# Patient Record
Sex: Female | Born: 1965 | Race: White | Hispanic: No | Smoking: Current every day smoker
Health system: Southern US, Community
[De-identification: ages and names within clinical notes are randomized; demographics above are authoritative.]

## PROBLEM LIST (undated history)

## (undated) DIAGNOSIS — M1990 Unspecified osteoarthritis, unspecified site: Secondary | ICD-10-CM

## (undated) DIAGNOSIS — G709 Myoneural disorder, unspecified: Secondary | ICD-10-CM

## (undated) DIAGNOSIS — S73004A Unspecified dislocation of right hip, initial encounter: Secondary | ICD-10-CM

## (undated) DIAGNOSIS — T7840XA Allergy, unspecified, initial encounter: Secondary | ICD-10-CM

## (undated) DIAGNOSIS — S129XXA Fracture of neck, unspecified, initial encounter: Secondary | ICD-10-CM

## (undated) HISTORY — DX: Myoneural disorder, unspecified: G70.9

## (undated) HISTORY — DX: Allergy, unspecified, initial encounter: T78.40XA

## (undated) HISTORY — DX: Unspecified osteoarthritis, unspecified site: M19.90

## (undated) HISTORY — DX: Unspecified dislocation of right hip, initial encounter: S73.004A

## (undated) HISTORY — DX: Fracture of neck, unspecified, initial encounter: S12.9XXA

## (undated) HISTORY — PX: HERNIA REPAIR: SHX51

---

## 2008-02-01 DIAGNOSIS — S129XXA Fracture of neck, unspecified, initial encounter: Secondary | ICD-10-CM

## 2008-02-01 HISTORY — DX: Fracture of neck, unspecified, initial encounter: S12.9XXA

## 2008-12-24 ENCOUNTER — Emergency Department: Payer: Self-pay | Admitting: Emergency Medicine

## 2012-11-02 ENCOUNTER — Telehealth: Payer: Self-pay | Admitting: Family Medicine

## 2012-11-05 NOTE — Telephone Encounter (Signed)
Patient needs to be seen. Has exceeded time since last visit. Needs to bring all medications to next appointment. Needs to come after the funeral and see a provider.

## 2012-11-05 NOTE — Telephone Encounter (Signed)
Left message on phone for pt to call in for an appt with a provider

## 2012-11-06 ENCOUNTER — Encounter: Payer: Self-pay | Admitting: Family Medicine

## 2012-11-06 ENCOUNTER — Ambulatory Visit (INDEPENDENT_AMBULATORY_CARE_PROVIDER_SITE_OTHER): Payer: BC Managed Care – PPO | Admitting: Family Medicine

## 2012-11-06 VITALS — BP 117/77 | HR 75 | Temp 97.7°F | Ht 69.0 in | Wt 145.2 lb

## 2012-11-06 DIAGNOSIS — M5380 Other specified dorsopathies, site unspecified: Secondary | ICD-10-CM

## 2012-11-06 DIAGNOSIS — M62830 Muscle spasm of back: Secondary | ICD-10-CM | POA: Insufficient documentation

## 2012-11-06 MED ORDER — CYCLOBENZAPRINE HCL 5 MG PO TABS: 5.0000 mg | ORAL_TABLET | Freq: Three times a day (TID) | ORAL | Status: DC | PRN

## 2012-11-06 NOTE — Progress Notes (Signed)
Patient ID: Kathryn Mccall, female   DOB: October 05, 1965, 47 y.o.   MRN: [id number] SUBJECTIVE: CC: Chief Complaint  Patient presents with  . Acute Visit    back spams husband passed away last week..states had vicodin yest, and last carisoprodol and 800 mg ibupjrofen, stattes  was given carisoprodol  at "[facility] PER PT".     Patient came late.  HPI: Says her husband died recently.keep reliving events. He was under Hospice care and passed away. Has back spasms. She called recently for xanax and she was told she needed to be seen. She now comes in requesting the usual combination that works for her back spasms, ie, vicodin and soma. She did not have any injuries associated wit the back spasm. She has in the past presented wit injuries related to horse back riding and has ridden even with back pain.  She has been to [facility] that gave her a few somas and ibuprofen. 800 mg.  When questioned about the referral to the pain clinic, she made comments that the pain clinic was useless and that what they wanted to rx for her was a waste of time. She never went back.  No past medical history on file. No past surgical history on file. History   Social History  . Marital Status: Unknown    Spouse Name: N/A    Number of Children: N/A  . Years of Education: N/A   Occupational History  . Not on file.   Social History Main Topics  . Smoking status: Never Smoker   . Smokeless tobacco: Not on file  . Alcohol Use: Not on file  . Drug Use: Not on file  . Sexual Activity: Not on file   Other Topics Concern  . Not on file   Social History Narrative  . No narrative on file   No family history on file. No current outpatient prescriptions on file prior to visit.   No current facility-administered medications on file prior to visit.   No Known Allergies  There is no immunization history on file for this patient. Prior to Admission medications   Medication Sig Start Date End Date Taking?  Authorizing Provider  cyclobenzaprine (FLEXERIL) 5 MG tablet Take 1 tablet (5 mg total) by mouth 3 (three) times daily as needed for muscle spasms. 11/06/12   Ileana Ladd, MD       ROS: As above in the HPI. All other systems are stable or negative.  OBJECTIVE: APPEARANCE:  Patient in no acute distress.The patient appeared well nourished and normally developed. Acyanotic. Waist: VITAL SIGNS:BP 117/77  Pulse 75  Temp(Src) 97.7 F (36.5 C) (Oral)  Ht 5\' 9"  (1.753 m)  Wt 145 lb 3.2 oz (65.862 kg)  BMI 21.43 kg/m2  LMP 09/06/2012  WF S[employer] built. Complaining the her mid-back was swollen. No swelling noted.!!! SKIN: warm and  Dry without overt rashes, tattoos and scars  HEAD and Neck: without JVD, Head and scalp: normal Eyes:No scleral icterus. Fundi normal, eye movements normal. Ears: Auricle normal, canal normal, Tympanic membranes normal, insufflation normal. Nose: normal Throat: normal Neck & thyroid: normal  CHEST & LUNGS: Chest wall: normal Lungs: Clear  CVS: Reveals the PMI to be normally located. Regular rhythm, First and Second Heart sounds are normal,  absence of murmurs, rubs or gallops. Peripheral vasculature: Radial pulses: normal Dorsal pedis pulses: normal Posterior pulses: normal  ABDOMEN:  Appearance: normal Benign, no organomegaly, no masses, no Abdominal Aortic enlargement. No Guarding ,  no rebound. No Bruits. Bowel sounds: normal  RECTAL: N/A GU: N/A  EXTREMETIES: nonedematous.  MUSCULOSKELETAL:  Spine: she complained that her back was painful and in spasm. Yet when she dropped her keys accidentally on th eground she bent over and picked it up.  She was placed flat on her back and had her raise one leg against resistance and the opposing leg went into extension into the table without any back pain.!!!  Joints: intact  NEUROLOGIC: oriented to time,place and person; nonfocal. Strength is normal Cranial Nerves are normal.  Explained  to patient that her exam was not in keeping with needing narcotics, she got angry and cursed profanities in the exam room. She was getting ready to leave and requested a muscle relaxant. I Rx 10 days of flexeril only. And offerred NSAIDs and she cursed further. i requested that she seek health care else where. She subsequently threatened to write up a bad report for not caring for her pain needs. She was asked to seek care elsewhere because narcotics was not appropriate.  ASSESSMENT: Initially coming in for xanax now requesting narcotic pain medications with clinical signs not in keeping with the need for moderate to severe pain control. Disruptive behavior. Muscle spasm of back   PLAN: Recommend Grief counselling at Hospice Continue with the chiropractor in [place]. Meds ordered this encounter  Medications  . cyclobenzaprine (FLEXERIL) 5 MG tablet    Sig: Take 1 tablet (5 mg total) by mouth 3 (three) times daily as needed for muscle spasms.    Dispense:  30 tablet    Refill:  0   Recommended NSAID but patient refused.  See above.  Suspect dug seeking behavious.  Recommended pain specialist and rehab specialist.  Patient left.  Araf Clugston P. Modesto Charon, M.D.

## 2014-01-31 DIAGNOSIS — S73004A Unspecified dislocation of right hip, initial encounter: Secondary | ICD-10-CM

## 2014-01-31 HISTORY — DX: Unspecified dislocation of right hip, initial encounter: S73.004A

## 2017-02-06 ENCOUNTER — Ambulatory Visit: Payer: Self-pay | Admitting: *Deleted

## 2017-02-06 NOTE — Telephone Encounter (Signed)
Pt having complaints of neck spasms after coming from [facility]. Pt states she was referred to Theora Gianottihelle Jeffrey, PA by [place] in the [facility] of [facility]. Pt has not been seen at this office and new pt appt was scheduled for Friday 1/11 at 2:40pm. Pt states she has been getting injections in her neck due to falling on a hay stack approximately 3 weeks ago and pt states " a chip broke loose". Pt states she broke her neck about 7 years ago and has been dealing with pain since that time. Pt asking if she would be given medication to help deal with the spasms. Pt states she feels like the right side of her neck is locking up. Pt advised to go to Urgent Care to be treated for the spasm today until seen in office on Friday. Pt verbalized understanding.

## 2017-02-10 ENCOUNTER — Encounter: Payer: Self-pay | Admitting: Physician Assistant

## 2017-02-10 ENCOUNTER — Ambulatory Visit: Payer: Self-pay | Admitting: Physician Assistant

## 2017-02-10 ENCOUNTER — Ambulatory Visit (INDEPENDENT_AMBULATORY_CARE_PROVIDER_SITE_OTHER): Payer: Self-pay

## 2017-02-10 ENCOUNTER — Other Ambulatory Visit: Payer: Self-pay

## 2017-02-10 VITALS — BP 130/84 | HR 78 | Temp 97.5°F | Resp 18 | Ht 69.0 in | Wt 157.4 lb

## 2017-02-10 DIAGNOSIS — M62838 Other muscle spasm: Secondary | ICD-10-CM

## 2017-02-10 DIAGNOSIS — M62830 Muscle spasm of back: Secondary | ICD-10-CM

## 2017-02-10 MED ORDER — MELOXICAM 15 MG PO TABS: 15.0000 mg | ORAL_TABLET | Freq: Every day | ORAL | 1 refills | Status: AC

## 2017-02-10 MED ORDER — HYDROCODONE-ACETAMINOPHEN 5-325 MG PO TABS: 1.0000 | ORAL_TABLET | Freq: Three times a day (TID) | ORAL | 0 refills | Status: AC | PRN

## 2017-02-10 NOTE — Patient Instructions (Addendum)
STOP the ibuprofen. Continue the Flexeril (cyclobenzaprine). Take the meloxicam WITH FOOD.   We recommend that you schedule a mammogram for breast cancer screening. Typically, you do not need a referral to do this. Please contact a local imaging center to schedule your mammogram.  [facility] - [phone]  *ask for the Radiology Department [facility] - [phone] or [phone]  [facility] - [phone] [facility] - [phone] [facility] - [phone]) [phone]  *ask for the Radiology Department [facility] - [phone]) [phone]  *ask for the Radiology Department [facility] - [phone]  *ask for the Mammography Department [facility] - [phone]) [phone]   IF you received an x-ray today, you will receive an invoice from [facility]. Please contact [facility] at [phone] with questions or concerns regarding your invoice.   IF you received labwork today, you will receive an invoice from [facility]. Please contact [facility] at [phone] with questions or concerns regarding your invoice.   Our billing staff will not be able to assist you with questions regarding bills from these companies.  You will be contacted with the lab results as soon as they are available. The fastest way to get your results is to activate your My Chart account. Instructions are located on the last page of this paperwork. If you have not heard from us regarding the results in 2 weeks, please contact this office.

## 2017-02-10 NOTE — Progress Notes (Signed)
Subjective:    Patient ID: Kathryn Mccall, female    DOB: 15-Oct-1965, 52 y.o.   MRN: [id number]  Chief Complaint  Patient presents with  . Neck Pain    x1 month, pt states she fell off a haystack. Pt states she can't sleep at night and get rid of the spasms.   Patient broke her neck 8 years ago. Surgery was recommended to her, but she has declined because she is afraid of having multiple surgeries and wants to seek relief without surgery. She "shattered her C2 and C3" and has had "numbness" and "electrical shocks" that go down her arm. Numbness is most persistent in her 4th and 5th bilateral fingers.    She was seen in Oct 2014 for back spasms, via clinic note from this visit: "her exam was not in keeping with needing narcotics, she got angry and cursed profanities in the exam room." The provider prescribed Flexeril and recommended a pain clinic.  She was seen at an [facility] at [facility] on 11/20/2016. After reading the note from the visit, her reason for visit was "fell off haystack 2 days ago, having pain in neck with "electricity" down arms. She was advised to go to the ER to get a CT scan of her neck to rule out serious injury. She declined. She was prescribed Flexeril x 2 days and told to follow-up with PCP. The provider did not feel comfortable prescribing narcotics. "She was adamant that she feels ok if she will get the Hydrocodone and Flexeril." The X-ray machine was broken at the office, so images were taken. Patient left AMA.   Today, the patient notes she fell 15 feet from a hay stack "about 4 weeks ago." She describes muscles spasms that come and go. She has been receiving dry needling treatment from [facility] for the past 3 weeks. These have been helping some. She has also had difficulties sleeping since she fell. She cannot get comfortable and wakes up 3-4 times a night. She had a headache a few days ago which lasted "30 hours" and associated symptoms: nausea, vomiting,  photophobia and phonophobia. The dry needling treatment helped relieve the headache.   She has been taking 1600 mg of Ibuprofen BID. She states she has had imaging done in the past, but it has been a few years. "Hydrocodone and Flexeril for a few days are what helps."    Review of Systems  Eyes: Negative for visual disturbance.  Musculoskeletal: Positive for gait problem, joint swelling, neck pain and neck stiffness.  Neurological: Positive for weakness, numbness and headaches. Negative for speech difficulty.  Psychiatric/Behavioral: Positive for sleep disturbance.   Patient Active Problem List   Diagnosis Date Noted  . Muscle spasms of neck 02/10/2017  . Muscle spasm of back 11/06/2012   Prior to Admission medications   Medication Sig Start Date End Date Taking? Authorizing Provider  Ibuprofen (ADVIL PO) Take by mouth.   Yes [provider]  TURMERIC PO Take by mouth.   Yes [provider]  cyclobenzaprine (FLEXERIL) 5 MG tablet Take 1 tablet (5 mg total) by mouth 3 (three) times daily as needed for muscle spasms. Patient not taking: Reported on 02/10/2017 11/06/12   Ileana Ladd, MD  HYDROcodone-acetaminophen [facility]) 5-325 MG tablet Take 1-2 tablets by mouth every 8 (eight) hours as needed. 02/10/17   Porfirio Oar, PA-C  ibuprofen (ADVIL,MOTRIN) 200 MG tablet Take 200 mg by mouth.    [provider]  meloxicam (MOBIC) 15 MG tablet Take 1 tablet (15 mg total) by mouth daily. 02/10/17   Porfirio OarJeffery, Chelle, PA-C   Allergies  Allergen Reactions  . Mushroom Extract Complex Swelling    Tongue swells      Objective:   Physical Exam  Constitutional: She appears well-developed and well-nourished.  Cardiovascular: Normal rate, regular rhythm and normal heart sounds.  Pulmonary/Chest: Effort normal and breath sounds normal.  Musculoskeletal:       Cervical back: She exhibits spasm (right scapula). She exhibits no tenderness, no bony tenderness and no swelling.    Neurological: She has normal reflexes. No sensory deficit (sharp/dull sensation normal for upper extremities ).  Reflex Scores:      Tricep reflexes are 2+ on the right side and 2+ on the left side.      Bicep reflexes are 2+ on the right side and 2+ on the left side.      Brachioradialis reflexes are 2+ on the right side and 2+ on the left side.      Patellar reflexes are 2+ on the right side and 2+ on the left side. Psychiatric: Her affect is blunt.      Assessment & Plan:  1. Muscle spasms of neck 2. Muscle spasm of back X-ray of cervical spine ordered.  Patient declined muscle relaxants and Gabapentin. "They do nothing for me."  Stop Ibuprofen. Continue Flexeril. Start Norco and Mobic.  [facility].   - DG Cervical Spine Complete; Future  - meloxicam (MOBIC) 15 MG tablet; Take 1 tablet (15 mg total) by mouth daily.  Dispense: 30 tablet; Refill: 1 - HYDROcodone-acetaminophen (NORCO) 5-325 MG tablet; Take 1-2 tablets by mouth every 8 (eight) hours as needed.  Dispense: 20 tablet; Refill: 0   Return for re-evaluation pending radiographs and effectiveness of treatment.  Alfonse Alpersaroline Terral Cooks, PA-S

## 2017-02-10 NOTE — Progress Notes (Signed)
Patient ID: Kathryn Mccall, female     DOB: Mar 16, 1965, 52 y.o.    MRN: [id number]  PCP: Patient, No Pcp Per  Chief Complaint  Patient presents with  . Neck Pain    x1 month, pt states she fell off a haystack. Pt states she can't sleep at night and get rid of the spasms.    Subjective:   This patient is new to me and presents for evaluation of spasms of the RIGHT neck.  She reports a horse accident 8-10 years ago in which she sustained fractures of C2 and C3. SInce then, she had pain intermittently, when she injures herself, reporting good results with Ibuprofen, Vicodin and muscle relaxers. RElates no benefit with previous use of gabapentin. She has intermittent numbness and weakness in the 4th and 5th fingers.  About 1 month ago, she fell approximately 15 feet off a haystack, exacerbating her neck pain. Ibuprofen 1600 mg BID without benefit. She was seen at another facility, where she was prescribed cyclobenzaprine, and advised to go to the ED for CT scan of the neck, which she did not do. She has been having dry needling with her PT for the past 3 weeks without substantial improvement, and the PT recommended me.  The pain is keeping her awake at night, so she is exhausted. 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 Pain also associated with headache, which she describes as a migraine, though she has never been formally diagnosed with migraine.  Chart reviewed. -Seen 11/20/16, reportedly 2 days after the fall from the haystack (related today as being 4 weeks ago), for neck pain and electricity in the arms. She requested hydrocodone and cyclobenzaprine. She was given a 2 day course of cyclobenzaprine, advised to go the ED for the CT scan and to follow-up with her primary care provider. She left AMA, reportedly upset.  -Seen 11/06/2012 for muscle spasm of the back. Her husband had recently died. "She called recently for xanax and  she was told she needed to be seen. She now comes in requesting the usual combination that works for her back spasms, ie, vicodin and soma. She did not have any injuries associated wit the back spasm. She has in the past presented wit injuries related to horse back riding and has ridden even with back pain. She has been to a [facility] that gave her a few somas and ibuprofen. 800 mg. When questioned about the referral to the pain clinic, she made comments that the pain clinic was useless and that what they wanted to rx for her was a waste of time. She never went back. Explained to patient that her exam was not in keeping with needing narcotics, she got angry and cursed profanities in the exam room. She was getting ready to leave and requested a muscle relaxant. I Rx 10 days of flexeril only. And offerred NSAIDs and she cursed further. i requested that she seek health care else where. She subsequently threatened to write up a bad report for not caring for her pain needs. She was asked to seek care elsewhere because narcotics was not appropriate. ASSESSMENT: Initially coming in for xanax now requesting narcotic pain medications with clinical signs not in keeping with the need for moderate to severe pain control. Disruptive behavior. Muscle spasm of back PLAN: Recommend Grief counselling at Hospice Continue with the chiropractor in [place]."  Review of Systems  Constitutional: Negative for activity  change, appetite change, chills, diaphoresis, fatigue, fever and unexpected weight change.  HENT: Negative for congestion, dental problem, drooling, ear discharge, ear pain, facial swelling, hearing loss, mouth sores, nosebleeds, postnasal drip, rhinorrhea, sinus pressure, sinus pain, sneezing, sore throat, tinnitus, trouble swallowing and voice change.   Eyes: Negative for photophobia, pain, discharge, redness, itching and visual disturbance.  Respiratory: Negative for apnea, cough, choking, chest  tightness, shortness of breath, wheezing and stridor.   Cardiovascular: Negative for chest pain, palpitations and leg swelling.  Gastrointestinal: Negative for abdominal distention, abdominal pain, anal bleeding, blood in stool, constipation, diarrhea, nausea, rectal pain and vomiting.  Endocrine: Negative for cold intolerance, heat intolerance, polydipsia, polyphagia and polyuria.  Genitourinary: Negative for decreased urine volume, difficulty urinating, dyspareunia, dysuria, enuresis, flank pain, frequency, genital sores, hematuria, menstrual problem, pelvic pain, urgency, vaginal bleeding, vaginal discharge and vaginal pain.  Musculoskeletal: Positive for arthralgias, gait problem, joint swelling, myalgias, neck pain and neck stiffness. Negative for back pain.  Skin: Negative for color change, pallor, rash and wound.  Allergic/Immunologic: Negative for environmental allergies, food allergies and immunocompromised state.  Neurological: Positive for weakness, numbness and headaches. Negative for dizziness, tremors, seizures, syncope, facial asymmetry, speech difficulty and light-headedness.  Hematological: Negative for adenopathy. Does not bruise/bleed easily.  Psychiatric/Behavioral: Positive for dysphoric mood and sleep disturbance. Negative for agitation, behavioral problems, confusion, decreased concentration, hallucinations, self-injury and suicidal ideas. The patient is not nervous/anxious and is not hyperactive.     Depression screen PHQ 2/9 02/10/2017  Decreased Interest 0  Down, Depressed, Hopeless 0  PHQ - 2 Score 0     Prior to Admission medications   Medication Sig Start Date End Date Taking? Authorizing Provider  Ibuprofen (ADVIL PO) Take by mouth.   Yes [provider]  TURMERIC PO Take by mouth.   Yes [provider]  cyclobenzaprine (FLEXERIL) 5 MG tablet Take 1 tablet (5 mg total) by mouth 3 (three) times daily as needed for muscle spasms. Patient not  taking: Reported on 02/10/2017 11/06/12   Ileana Ladd, MD     No Known Allergies   Patient Active Problem List   Diagnosis Date Noted  . Muscle spasm of back 11/06/2012     Family History  Problem Relation Age of Onset  . Heart disease Mother   . Stroke Mother      Social History   Socioeconomic History  . Marital status: Widowed    Spouse name: Not on file  . Number of children: 1  . Years of education: College  . Highest education level: Not on file  Social Needs  . Financial resource strain: Not on file  . Food insecurity - worry: Not on file  . Food insecurity - inability: Not on file  . Transportation needs - medical: Not on file  . Transportation needs - non-medical: Not on file  Occupational History  . Occupation: Paediatric nurse  Tobacco Use  . Smoking status: Current Every Day Smoker    Packs/day: 0.50    Years: 10.00    Pack years: 5.00    Types: Cigarettes  . Smokeless tobacco: Never Used  Substance and Sexual Activity  . Alcohol use: Yes    Alcohol/week: 1.2 - 1.8 oz    Types: 2 - 3 Standard drinks or equivalent per week  . Drug use: Yes    Types: Marijuana  . Sexual activity: No  Other Topics Concern  . Not on file  Social History Narrative   Widowed in  2014.   [place] a farm, manages a large horse barn.         Objective:  Physical Exam  Constitutional: She is oriented to person, place, and time. She appears well-developed and well-nourished. She is active and cooperative. No distress.  BP 130/84 (BP Location: Left Arm, Patient Position: Sitting, Cuff Size: Normal)   Pulse 78   Temp (!) 97.5 F (36.4 C) (Oral)   Resp 18   Ht 5\' 9"  (1.753 m)   Wt 157 lb 6.4 oz (71.4 kg)   SpO2 98%   BMI 23.24 kg/m   HENT:  Head: Normocephalic and atraumatic.  Right Ear: Hearing normal.  Left Ear: Hearing normal.  Eyes: Conjunctivae are normal. No scleral icterus.  Neck: Normal range of motion. Neck supple. No thyromegaly present.    Cardiovascular: Normal rate, regular rhythm and normal heart sounds.  Pulses:      Radial pulses are 2+ on the right side, and 2+ on the left side.  Pulmonary/Chest: Effort normal and breath sounds normal.  Musculoskeletal:       Cervical back: She exhibits decreased range of motion, tenderness, pain and spasm. She exhibits no bony tenderness, no swelling, no edema, no deformity, no laceration and normal pulse.       Back:  Lymphadenopathy:       Head (right side): No tonsillar, no preauricular, no posterior auricular and no occipital adenopathy present.       Head (left side): No tonsillar, no preauricular, no posterior auricular and no occipital adenopathy present.    She has no cervical adenopathy.       Right: No supraclavicular adenopathy present.       Left: No supraclavicular adenopathy present.  Neurological: She is alert and oriented to person, place, and time. She has normal strength. No cranial nerve deficit or sensory deficit.  Skin: Skin is warm, dry and intact. No rash noted. No cyanosis or erythema. Nails show no clubbing.  Psychiatric: Her speech is normal and behavior is normal. Judgment and thought content normal. Her mood appears anxious. Her affect is not angry, not blunt, not labile and not inappropriate. Cognition and memory are normal. She does not exhibit a depressed mood.             Assessment & Plan:   Problem List Items Addressed This Visit    Muscle spasm of back   Muscle spasms of neck - Primary    Acute on chronic. Await radiographs. STOP ibuprofen. She is over using it. Trial of meloxicam. Declines re-try of gabapentin. Has not tried pregabalin, duloxetine. Continue cyclobenzaprine. Short course of hydrocodone.      Relevant Medications   meloxicam (MOBIC) 15 MG tablet   HYDROcodone-acetaminophen (NORCO) 5-325 MG tablet   Other Relevant Orders   DG Cervical Spine Complete (Completed)       Return for re-evaluation pending radiographs  and effectiveness of treatment.   Fernande Brashelle S. Benford Asch, PA-C Primary Care at [facility]

## 2017-02-11 ENCOUNTER — Encounter: Payer: Self-pay | Admitting: Physician Assistant

## 2017-02-11 NOTE — Progress Notes (Signed)
[place]CC[place] reviewed. No controlled substance prescriptions for this patient in the past 24 months.

## 2017-02-11 NOTE — Assessment & Plan Note (Signed)
Acute on chronic. Await radiographs. STOP ibuprofen. She is over using it. Trial of meloxicam. Declines re-try of gabapentin. Has not tried pregabalin, duloxetine. Continue cyclobenzaprine. Short course of hydrocodone.

## 2017-02-14 ENCOUNTER — Telehealth: Payer: Self-pay | Admitting: Physician Assistant

## 2017-02-14 MED ORDER — CYCLOBENZAPRINE HCL 5 MG PO TABS: 5.0000 mg | ORAL_TABLET | Freq: Three times a day (TID) | ORAL | 0 refills | Status: DC | PRN

## 2017-02-14 NOTE — Telephone Encounter (Signed)
-----   Message from Clydia Llanoodica Rosenthal sent at 02/14/2017  3:18 PM EST ----- Called, spoke with patient, informed of results. The patient stated that between Mobic and Hydrocodone she feel much better, but she will like something for sleep, some muscle relaxant. In 2014 she had Flexeril and helped her. [facility] is [facility] [address] [place]. Thank you.

## 2017-02-14 NOTE — Telephone Encounter (Signed)
Meds ordered this encounter  Medications  . cyclobenzaprine (FLEXERIL) 5 MG tablet    Sig: Take 1 tablet (5 mg total) by mouth 3 (three) times daily as needed for muscle spasms.    Dispense:  30 tablet    Refill:  0    Order Specific Question:   Supervising Provider    Answer:   Clelia CroftSHAW, EVA N [[id number]]

## 2019-01-22 ENCOUNTER — Telehealth: Payer: Self-pay | Admitting: Family Medicine

## 2019-01-22 NOTE — Telephone Encounter (Signed)
Received a referral from [place] for the patient to see Dr Tamala Julian for Arthralgia.  Called patient and left message for her to call back to schedule an appointment.

## 2020-08-04 ENCOUNTER — Emergency Department (HOSPITAL_COMMUNITY)
Admission: EM | Admit: 2020-08-04 | Discharge: 2020-08-05 | Disposition: A | Discharge status: Other Acute Inpatient Hospital | Payer: Self-pay | Attending: Emergency Medicine | Admitting: Emergency Medicine

## 2020-08-04 ENCOUNTER — Other Ambulatory Visit: Payer: Self-pay

## 2020-08-04 ENCOUNTER — Encounter (HOSPITAL_COMMUNITY): Payer: Self-pay | Admitting: Emergency Medicine

## 2020-08-04 DIAGNOSIS — Z20822 Contact with and (suspected) exposure to covid-19: Secondary | ICD-10-CM | POA: Insufficient documentation

## 2020-08-04 DIAGNOSIS — F17210 Nicotine dependence, cigarettes, uncomplicated: Secondary | ICD-10-CM | POA: Insufficient documentation

## 2020-08-04 DIAGNOSIS — F23 Brief psychotic disorder: Secondary | ICD-10-CM | POA: Insufficient documentation

## 2020-08-04 LAB — COMPREHENSIVE METABOLIC PANEL
ALT: 17 U/L (ref 0–44)
AST: 25 U/L (ref 15–41)
Albumin: 4.5 g/dL (ref 3.5–5.0)
Alkaline Phosphatase: 104 U/L (ref 38–126)
Anion gap: 11 (ref 5–15)
BUN: 25 mg/dL — ABNORMAL HIGH (ref 6–20)
CO2: 24 mmol/L (ref 22–32)
Calcium: 9.4 mg/dL (ref 8.9–10.3)
Chloride: 104 mmol/L (ref 98–111)
Creatinine, Ser: 0.79 mg/dL (ref 0.44–1.00)
GFR, Estimated: >60 mL/min (ref >60)
Glucose, Bld: 100 mg/dL — ABNORMAL HIGH (ref 70–99)
Potassium: 4.5 mmol/L (ref 3.5–5.1)
Sodium: 139 mmol/L (ref 135–145)
Total Bilirubin: 0.5 mg/dL (ref 0.3–1.2)
Total Protein: 8.3 g/dL — ABNORMAL HIGH (ref 6.5–8.1)

## 2020-08-04 LAB — ETHANOL: Alcohol, Ethyl (B): <10 mg/dL (ref <10)

## 2020-08-04 LAB — CBC WITH DIFFERENTIAL/PLATELET
Abs Immature Granulocytes: 0.02 10*3/uL (ref 0.00–0.07)
Basophils Absolute: 0.1 10*3/uL (ref 0.0–0.1)
Basophils Relative: 1 %
Eosinophils Absolute: 0.2 10*3/uL (ref 0.0–0.5)
Eosinophils Relative: 2 %
HCT: 41.1 % (ref 36.0–46.0)
Hemoglobin: 14.2 g/dL (ref 12.0–15.0)
Immature Granulocytes: 0 %
Lymphocytes Relative: 32 %
Lymphs Abs: 3.8 10*3/uL (ref 0.7–4.0)
MCH: 32 pg (ref 26.0–34.0)
MCHC: 34.5 g/dL (ref 30.0–36.0)
MCV: 92.6 fL (ref 80.0–100.0)
Monocytes Absolute: 1 10*3/uL (ref 0.1–1.0)
Monocytes Relative: 8 %
Neutro Abs: 6.6 10*3/uL (ref 1.7–7.7)
Neutrophils Relative %: 57 %
Platelets: 303 10*3/uL (ref 150–400)
RBC: 4.44 MIL/uL (ref 3.87–5.11)
RDW: 13.5 % (ref 11.5–15.5)
WBC: 11.7 10*3/uL — ABNORMAL HIGH (ref 4.0–10.5)
nRBC: 0 % (ref 0.0–0.2)

## 2020-08-04 LAB — TSH: TSH: 3.76 u[IU]/mL (ref 0.350–4.500)

## 2020-08-04 MED ORDER — ZOLPIDEM TARTRATE 5 MG PO TABS: 5.0000 mg | ORAL_TABLET | Freq: Every evening | ORAL | Status: DC | PRN

## 2020-08-04 MED ORDER — ONDANSETRON HCL 4 MG PO TABS: 4.0000 mg | ORAL_TABLET | Freq: Three times a day (TID) | ORAL | Status: DC | PRN

## 2020-08-04 MED ORDER — NICOTINE 21 MG/24HR TD PT24
21.0000 mg | MEDICATED_PATCH | Freq: Every day | TRANSDERMAL | Status: DC
  Filled 2020-08-04: qty 1

## 2020-08-04 MED ORDER — ALUM & MAG HYDROXIDE-SIMETH 200-200-20 MG/5ML PO SUSP: 30.0000 mL | Freq: Four times a day (QID) | ORAL | Status: DC | PRN

## 2020-08-04 MED ORDER — ACETAMINOPHEN 325 MG PO TABS: 650.0000 mg | ORAL_TABLET | ORAL | Status: DC | PRN

## 2020-08-04 NOTE — ED Notes (Signed)
IVC paper work faxed to [employer] office at this time. Paper work also faxed to [facility] @ [phone] and [phone].

## 2020-08-04 NOTE — ED Triage Notes (Signed)
Pt brought in with [facility] due to having delusional thoughts. Last night pt let her horses free since God told her too. Then went and laid in creek beds because God told her too. Pt also went into someone's cabin without permission due to God telling her too.

## 2020-08-04 NOTE — ED Provider Notes (Signed)
[facility] EMERGENCY DEPARTMENT Provider Note   CSN: [id number] Arrival date & time: 08/04/20  1946     History Chief Complaint  Patient presents with   Psychiatric Evaluation    Kathryn Mccall is a 55 y.o. female.  HPI  This patient is a 55 year old female, according to the medical history she has a history of attention deficit disorder, she states that she takes occasional Adderall for this, I have reviewed the medical record and in February 28, 2020 the patient was seen, she had refills for her Adderall, she was also given cyclobenzaprine for muscle spasms, hydrocodone acetaminophen for back pain.  The patient presents in the care of the [facility] department after she was found with some very unusual behavior.  Evidently she had let all of the horses out [place], she states that someone was after her and she states that someone flying a Psychologist, occupational was supposed to come and pick her up at the airport.  She was found in a cabin, she has perseverations on God telling her to do different things.  She reports to me that she smokes marijuana, she also states that she uses mushrooms but will only use all natural things that she can grow.  She will not use any harder drugs.  The patient denies any physical complaints at this time, and during the course of the interview she has multiple different delusional thoughts.  It is unclear when the last time the patient slept was.  She denies other physical complaint.  Level 5 caveat applies secondary to the patient's delusional thought  Past Medical History:  Diagnosis Date   Allergy    Arthritis    Cervical spine fracture (HCC) 2010   Hip dislocation, right (HCC) 2016   Neuromuscular disorder [facility])     Patient Active Problem List   Diagnosis Date Noted   Muscle spasms of neck 02/10/2017   Muscle spasm of back 11/06/2012    Past Surgical History:  Procedure Laterality Date    CESAREAN SECTION  1996   HERNIA REPAIR     55 years old     OB History   No obstetric history on file.     Family History  Problem Relation Age of Onset   Heart disease Mother    Stroke Mother    Leukemia Maternal Grandmother     Social History   Tobacco Use   Smoking status: Every Day    Packs/day: 0.50    Years: 10.00    Pack years: 5.00    Types: Cigarettes   Smokeless tobacco: Never  Vaping Use   Vaping Use: Never used  Substance Use Topics   Alcohol use: Yes    Alcohol/week: 2.0 - 3.0 standard drinks    Types: 2 - 3 Standard drinks or equivalent per week   Drug use: Yes    Types: Marijuana    Home Medications Prior to Admission medications   Medication Sig Start Date End Date Taking? Authorizing Provider  cyclobenzaprine (FLEXERIL) 5 MG tablet Take 1 tablet (5 mg total) by mouth 3 (three) times daily as needed for muscle spasms. 02/14/17   Porfirio Oar, PA  HYDROcodone-acetaminophen (NORCO) 5-325 MG tablet Take 1-2 tablets by mouth every 8 (eight) hours as needed. 02/10/17   Porfirio Oar, PA  Ibuprofen (ADVIL PO) Take by mouth.    [provider]  ibuprofen (ADVIL,MOTRIN) 200 MG tablet Take 200 mg by mouth.  [provider]  meloxicam (MOBIC) 15 MG tablet Take 1 tablet (15 mg total) by mouth daily. 02/10/17   Porfirio Oar, PA  TURMERIC PO Take by mouth.    [provider]    Allergies    Mushroom extract complex  Review of Systems   Review of Systems  Unable to perform ROS: Psychiatric disorder   Physical Exam Updated Vital Signs BP (!) 140/105 (BP Location: Right Arm)   Pulse 94   Temp 97.9 F (36.6 C) (Oral)   Resp 16   Ht 1.753 m (5\' 9" )   Wt 71.4 kg   SpO2 100%   BMI 23.25 kg/m   Physical Exam Vitals and nursing note reviewed.  Constitutional:      General: She is not in acute distress.    Appearance: She is well-developed.  HENT:     Head: Normocephalic and atraumatic.     Mouth/Throat:      Pharynx: No oropharyngeal exudate.  Eyes:     General: No scleral icterus.       Right eye: No discharge.        Left eye: No discharge.     Conjunctiva/sclera: Conjunctivae normal.     Pupils: Pupils are equal, round, and reactive to light.  Neck:     Thyroid: No thyromegaly.     Vascular: No JVD.  Cardiovascular:     Rate and Rhythm: Normal rate and regular rhythm.     Heart sounds: Normal heart sounds. No murmur heard.   No friction rub. No gallop.  Pulmonary:     Effort: Pulmonary effort is normal. No respiratory distress.     Breath sounds: Normal breath sounds. No wheezing or rales.  Abdominal:     General: Bowel sounds are normal. There is no distension.     Palpations: Abdomen is soft. There is no mass.     Tenderness: There is no abdominal tenderness.  Musculoskeletal:        General: No tenderness. Normal range of motion.     Cervical back: Normal range of motion and neck supple.  Lymphadenopathy:     Cervical: No cervical adenopathy.  Skin:    General: Skin is warm and dry.     Findings: No erythema or rash.  Neurological:     Mental Status: She is alert.     Coordination: Coordination normal.  Psychiatric:     Comments: Bizarre thoughts, flights of ideas, slightly pressured speech, curses frequently, talks about God frequently, delusional thoughts, denies suicidality or homicidality    ED Results / Procedures / Treatments   Labs (all labs ordered are listed, but only abnormal results are displayed) Labs Reviewed  COMPREHENSIVE METABOLIC PANEL - Abnormal; Notable for the following components:      Result Value   Glucose, Bld 100 (*)    BUN 25 (*)    Total Protein 8.3 (*)    All other components within normal limits  CBC WITH DIFFERENTIAL/PLATELET - Abnormal; Notable for the following components:   WBC 11.7 (*)    All other components within normal limits  RESP PANEL BY RT-PCR (FLU A&B, COVID) ARPGX2  ETHANOL  TSH  RAPID URINE DRUG SCREEN, HOSP PERFORMED   POC URINE PREG, ED    EKG None  Radiology No results found.  Procedures Procedures   Medications Ordered in ED Medications  acetaminophen (TYLENOL) tablet 650 mg (has no administration in time range)  zolpidem (AMBIEN) tablet 5 mg (has no administration in time  range)  ondansetron (ZOFRAN) tablet 4 mg (has no administration in time range)  alum & mag hydroxide-simeth (MAALOX/MYLANTA) 200-200-20 MG/5ML suspension 30 mL (has no administration in time range)  nicotine (NICODERM CQ - dosed in mg/24 hours) patch 21 mg (21 mg Transdermal Patient Refused/Not Given 08/04/20 2152)    ED Course  I have reviewed the triage vital signs and the nursing notes.  Pertinent labs & imaging results that were available during my care of the patient were reviewed by me and considered in my medical decision making (see chart for details).    MDM Rules/Calculators/A&P                          The patient has a very bizarre presentation of new onset delusions.  I do not see any history of psychiatric conditions other than attention deficit disorder.  She will need psychiatric evaluation, she will need to be involuntarily committed as the patient does have high risk of worsening condition and is not able to care for self at this time.  Laboratory work-up has been reviewed thus far, this patient is stable for evaluation and medically cleared for psychiatric placement  Change of shift - care signed out to Dr. Blinda Leatherwood  Final Clinical Impression(s) / ED Diagnoses Final diagnoses:  Acute psychosis [facility])    Rx / DC Orders ED Discharge Orders     None        Eber Hong, MD 08/04/20 2253

## 2020-08-04 NOTE — BH Assessment (Addendum)
Comprehensive Clinical Assessment (CCA) Note  08/05/2020 Kathryn Mccall [id number]  Discharge Disposition: Melbourne Abts, PA-C, has determined pt meets inpatient criteria. [name] referral information will be reviewed by [facility] Tosin, RN, to determine if an appropriate bed is available for her; if an appropriate bed is not available, pt's referral information will be faxed out to multiple hospitals for potential placement by SW. This information was relayed to pt's team at 2357.  The patient demonstrates the following risk factors for suicide: Chronic risk factors for suicide include: psychiatric disorder of Unspecified psychotic disorder . Acute risk factors for suicide include:  UTA . Protective factors for this patient include:  UTA . Considering these factors, the overall suicide risk at this point appears to be none. Patient is not appropriate for outpatient follow up.  Therefore, no sitter is recommended for suicide precautions.  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 The IVC states:  "Acutely delusional and psychosis. Let horses out of enclosure, sleeping in creek bed, breaking into cabins."  Pt denies SI, a hx of SI, prior attempts to kill herself, a plan to kill herself, or prior hospitalizations for mental health concerns. Pt denies HI, AH, NSSIB, or engagement with the legal system. She endorses experiencing VH, stating, "there's been signs in the sky." Pt shares she owns many guns due [place]. She shares she uses "mushrooms" recreationally and that she "micro-doses" on marijuana (takes 1-2  "hits") several times/day.  Pt provided verbal consent for clinician to make contact with a friend, Marta Lamas, at [phone]; clinician left a HIPAA-compliant voicemail message at [phone] requesting a call back to the [facility]). As of 0245 a returned phone call has not been received.  [name] UDA was positive for opiates, amphetamines, and THC.  [name] orientation and memory was UTA. Pt was cooperative throughout the assessment process. [name] insight, judgement, and impulse control is poor at this time.  Patient Reported Information How did you hear about [place]? Other (Comment) ([facility], [facility]P)  What Is the Reason for Your Visit/Call Today? Pt states she was brought to the hospital because, "they needed my blood to heal the nation; I'm the 1%." Pt shares this has been occurring "since the beginning of time." Pt has no known hx of delusions.  How Long Has This Been Causing You Problems? <Week  What Do You Feel Would Help You the Most Today? -- (UTA)   Have You Recently Had Any Thoughts About Hurting Yourself? No  Are You Planning to Commit Suicide/Harm Yourself At This time? No   Have you Recently Had Thoughts About Hurting Someone Karolee Ohs? No  Are You Planning to Harm Someone at This Time? No  Explanation: No data recorded  Have You Used Any Alcohol or Drugs in the Past 24 Hours? Yes  How Long Ago Did You Use Drugs or Alcohol? No data recorded What Did You Use and How Much? Pt states she used marijuana this morning.   Do You Currently Have a Therapist/Psychiatrist? -- (UTA)  Name of Therapist/Psychiatrist: No data recorded  Have You Been Recently Discharged From Any Office Practice or Programs? -- (UTA)  Explanation of Discharge From Practice/Program: No data recorded    CCA Screening Triage Referral Assessment Type of Contact: Tele-Assessment  Telemedicine Service Delivery: Telemedicine service delivery: This service was provided via telemedicine using a  2-way, interactive audio and video technology  Is this Initial or Reassessment? Initial Assessment  Date Telepsych consult ordered in [facility]:  08/04/20  Time Telepsych consult ordered in [facility]:  2109  Location of Assessment: [facility]  Provider Location: [facility] Assessment Services   Collateral Involvement: Pt provided verbal consent for clinician to speak to Marta Lamas. Clinician left a HIPAA-compliant voicemail message at [phone].   Does Patient Have a Automotive engineer Guardian? No data recorded Name and Contact of Legal Guardian: No data recorded If Minor and Not Living with Parent(s), Who has Custody? N/A  Is CPS involved or ever been involved? Never  Is APS involved or ever been involved? Never   Patient Determined To Be At Risk for Harm To Self or Others Based on Review of Patient Reported Information or Presenting Complaint? Yes, for Self-Harm  Method: No data recorded Availability of Means: No data recorded Intent: No data recorded Notification Required: No data recorded Additional Information for Danger to Others Potential: No data recorded Additional Comments for Danger to Others Potential: No data recorded Are There Guns or Other Weapons in Your Home? No data recorded Types of Guns/Weapons: No data recorded Are These Weapons Safely Secured?                            No data recorded Who Could Verify You Are Able To Have These Secured: No data recorded Do You Have any Outstanding Charges, Pending Court Dates, Parole/Probation? No data recorded Contacted To Inform of Risk of Harm To Self or Others: Law Enforcement    Does Patient Present under Involuntary Commitment? Yes  IVC Papers Initial File Date: 08/04/20   [place] of Residence: [place]   Patient Currently Receiving the Following Services: -- (UTA)   Determination of Need: Emergent (2 hours)   Options For Referral: Inpatient Hospitalization     CCA Biopsychosocial Patient Reported  Schizophrenia/Schizoaffective Diagnosis in Past: -- (UTA)   Strengths: Pt states she grows her own food and tries to primarily only eat what she grows.   Mental Health Symptoms Depression:   None   Duration of Depressive symptoms:    Mania:   Increased Energy; Overconfidence; Racing thoughts; Change in energy/activity   Anxiety:    None   Psychosis:   Delusions   Duration of Psychotic symptoms:  Duration of Psychotic Symptoms: Less than six months   Trauma:   None   Obsessions:   None   Compulsions:   None   Inattention:   None   Hyperactivity/Impulsivity:   None   Oppositional/Defiant Behaviors:   None   Emotional Irregularity:   Potentially harmful impulsivity   Other Mood/Personality Symptoms:   None noted    Mental Status Exam Appearance and self-care  Stature:   Average   Weight:   Average weight   Clothing:   -- (Pt is dressed in scrubs)   Grooming:   Normal   Cosmetic use:   None   Posture/gait:   Normal   Motor activity:   Not Remarkable   Sensorium  Attention:   Confused   Concentration:   Scattered   Orientation:   -- (  UTA)   Recall/memory:   -- (UTA)   Affect and Mood  Affect:   Full Range   Mood:   Euphoric   Relating  Eye contact:   Normal   Facial expression:   Responsive   Attitude toward examiner:   Cooperative   Thought and Language  Speech flow:  Flight of Ideas   Thought content:   Delusions   Preoccupation:   None   Hallucinations:   Visual (Pt states, "there's been signs in the sky.")   Organization:  No data recorded  Affiliated Computer ServicesExecutive Functions  Fund of Knowledge:   Average   Intelligence:   Average   Abstraction:   Abstract   Judgement:   Dangerous   Reality Testing:   Distorted   Insight:   Poor   Decision Making:   Confused   Social Functioning  Social Maturity:   Impulsive   Social Judgement:   Heedless   Stress  Stressors:   -- Industrial/product designer(UTA)   Coping Ability:    -- Industrial/product designer(UTA)   Skill Deficits:   Decision making; Self-control   Supports:   Friends/Service system     Religion: Religion/Spirituality Are You A Religious Person?:  (Pt states she is a spiritual person) How Might This Affect Treatment?: UTA  Leisure/Recreation: Leisure / Recreation Do You Have Hobbies?: Yes Leisure and Hobbies: Gardening, cooking the food she grows  Exercise/Diet: Exercise/Diet Do You Exercise?:  (UTA) Have You Gained or Lost A Significant Amount of Weight in the Past Six Months?:  (UTA) Do You Follow a Special Diet?:  (UTA) Do You Have Any Trouble Sleeping?:  (UTA)   CCA Employment/Education Employment/Work Situation: Employment / Work Situation Employment Situation:  Industrial/product designer(UTA) Patient's Job has Been Impacted by Current Illness:  (UTA) Has Patient ever Been in the [employer]ancorp[employer]?:  (UTA)  Education: Education Is Patient Currently Attending School?:  (UTA) Last Grade Completed:  (UTA) Did You Attend College?:  (UTA) Did You Have An Individualized Education Program (IIEP):  (UTA) Did You Have Any Difficulty At School?:  (UTA) Patient's Education Has Been Impacted by Current Illness:  (UTA)   CCA Family/Childhood History Family and Relationship History: Family history Marital status:  (UTA) Does patient have children?:  (UTA)  Childhood History:  Childhood History By whom was/is the patient raised?:  (UTA) Did patient suffer any verbal/emotional/physical/sexual abuse as a child?:  (UTA) Did patient suffer from severe childhood neglect?:  (UTA) Has patient ever been sexually abused/assaulted/raped as an adolescent or adult?:  (UTA) Was the patient ever a victim of a crime or a disaster?:  (UTA) Witnessed domestic violence?:  (UTA) Has patient been affected by domestic violence as an adult?:  Industrial/product designer(UTA)  Child/Adolescent Assessment:     CCA Substance Use Alcohol/Drug Use: Alcohol / Drug Use Pain Medications: See MAR Prescriptions: See MAR Over the  Counter: See MAR History of alcohol / drug use?: Yes Longest period of sobriety (when/how long): UTA Negative Consequences of Use:  (UTA) Withdrawal Symptoms:  (UTA) Substance #1 Name of Substance 1: Marijuana 1 - Age of First Use: UTA 1 - Amount (size/oz): Micro-doses throughout the day; less than 1/2 gram 1 - Frequency: Daily 1 - Duration: UTA 1 - Last Use / Amount: 08/04/2020 AM 1 - Method of Aquiring: UTA 1- Route of Use: Smoke Substance #2 Name of Substance 2: "Mushrooms" 2 - Age of First Use: UTA 2 - Amount (size/oz): UTA 2 - Frequency: UTA 2 - Duration: UTA 2 - Last Use / Amount:  UTA 2 - Method of Aquiring: Pt grows 2 - Route of Substance Use: Eats                     ASAM's:  Six Dimensions of Multidimensional Assessment  Dimension 1:  Acute Intoxication and/or Withdrawal Potential:   Dimension 1:  Description of individual's past and current experiences of substance use and withdrawal:  (UTA)  Dimension 2:  Biomedical Conditions and Complications:   Dimension 2:  Description of patient's biomedical conditions and  complications:  (UTA)  Dimension 3:  Emotional, Behavioral, or Cognitive Conditions and Complications:  Dimension 3:  Description of emotional, behavioral, or cognitive conditions and complications:  (UTA)  Dimension 4:  Readiness to Change:  Dimension 4:  Description of Readiness to Change criteria:  (UTA)  Dimension 5:  Relapse, Continued use, or Continued Problem Potential:  Dimension 5:  Relapse, continued use, or continued problem potential critiera description:  (UTA)  Dimension 6:  Recovery/Living Environment:  Dimension 6:  Recovery/Iiving environment criteria description:  (UTA)  ASAM Severity Score:    ASAM Recommended Level of Treatment: ASAM Recommended Level of Treatment:  (UTA)   Substance use Disorder (SUD) Substance Use Disorder (SUD)  Checklist Symptoms of Substance Use:  (UTA)  Recommendations for  Services/Supports/Treatments: Recommendations for Services/Supports/Treatments Recommendations For Services/Supports/Treatments: Inpatient Hospitalization  Discharge Disposition: Melbourne Abts, PA-C, has determined pt meets inpatient criteria. [name] referral information will be reviewed by [facility] Tosin, RN, to determine if an appropriate bed is available for her; if an appropriate bed is not available, pt's referral information will be faxed out to multiple hospitals for potential placement by SW. This information was relayed to pt's team at 2357.  DSM5 Diagnoses: Patient Active Problem List   Diagnosis Date Noted   Muscle spasms of neck 02/10/2017   Muscle spasm of back 11/06/2012     Referrals to Alternative Service(s): Referred to Alternative Service(s):   Place:   Date:   Time:    Referred to Alternative Service(s):   Place:   Date:   Time:    Referred to Alternative Service(s):   Place:   Date:   Time:    Referred to Alternative Service(s):   Place:   Date:   Time:     Ralph Dowdy, LMFT

## 2020-08-05 LAB — RESP PANEL BY RT-PCR (FLU A&B, COVID) ARPGX2
Influenza A by PCR: NEGATIVE
Influenza B by PCR: NEGATIVE
SARS Coronavirus 2 by RT PCR: NEGATIVE

## 2020-08-05 LAB — RAPID URINE DRUG SCREEN, HOSP PERFORMED
Amphetamines: POSITIVE — AB
Barbiturates: NOT DETECTED
Benzodiazepines: NOT DETECTED
Cocaine: NOT DETECTED
Opiates: POSITIVE — AB
Tetrahydrocannabinol: POSITIVE — AB

## 2020-08-05 LAB — PREGNANCY, URINE: Preg Test, Ur: NEGATIVE

## 2020-08-05 NOTE — ED Notes (Signed)
Patient lying in bed and alert to voice. Respirations even and unlabored. NAD noted. Report received from Ginger, Charity fundraiser.

## 2020-08-05 NOTE — Progress Notes (Signed)
Patient information has been sent to [facility] via secure chat to review for potential admission. Patient meets inpatient criteria per Melbourne Abts, PA-C.   Situation ongoing, CSW will continue to monitor progress.    Signed:  Damita Dunnings, MSW, LCSW-A  08/05/2020 9:53 AM

## 2020-08-05 NOTE — ED Notes (Signed)
Patient still has hair tie up

## 2020-08-05 NOTE — ED Provider Notes (Signed)
Emergency Medicine Observation Re-evaluation Note  Kathryn Mccall is a 55 y.o. female, seen on rounds today.  Pt initially presented to the ED for complaints of Psychiatric Evaluation Currently, the patient is calm.  Physical Exam  BP 132/90   Pulse 72   Temp 98.2 F (36.8 C) (Oral)   Resp 16   Ht 5\' 9"  (1.753 m)   Wt 71.4 kg   SpO2 100%   BMI 23.25 kg/m  Physical Exam General: NAD Cardiac: regular rate Lungs: no distress Psych: calm  ED Course / MDM  EKG:   I have reviewed the labs performed to date as well as medications administered while in observation.  Recent changes in the last 24 hours include : none - awaiting placement.  Plan  Current plan is for placement.   11:59 AM Accepted to [facility]   [facility], MD 08/05/20 1159

## 2020-08-05 NOTE — ED Notes (Signed)
Pt was very cooperative :)

## 2020-08-05 NOTE — ED Notes (Signed)
Patient used cellphone at this time. And states to her caller Miracle that she is ready to leave and walk up out of here. Patient redirected at this time.

## 2020-08-05 NOTE — Progress Notes (Signed)
Patient has been faxed out due to no available beds at [facility]. Patient meets inpatient criteria per [facility]. Patient referred to the following facilities:  [facility]  [address] [place] [address] [phone]  [facility]  [address], [place] [address] [phone]  [facility]  [address] [place] [address] [phone]  [facility]  [address], [place] [address] [phone]  [facility]  [address] [place] [address] [phone]  [facility]  [address], [place] [address] [phone]  [facility]  [address] [place], [place] [address] [phone]  [facility]  [address] [place], [place] [address] [phone]  [facility]  [address] [place] [address] [phone]  [facility]  [address], [place] [address] [phone]  [facility]  [address], [place] [address] [phone]  [facility]  [address] [place] [address] [phone]    CSW will continue to monitor disposition.    Damita Dunnings, MSW, LCSW-A  10:01 AM 08/05/2020

## 2020-08-05 NOTE — BH Assessment (Addendum)
Secure chat sent to pt's RN with request for TTS consult and cart set up. Advised by RN pt is discharged.
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
## 2020-09-06 ENCOUNTER — Emergency Department (HOSPITAL_COMMUNITY)
Admission: EM | Admit: 2020-09-06 | Discharge: 2020-09-07 | Disposition: A | Discharge status: Other Acute Inpatient Hospital | Payer: Self-pay | Attending: Emergency Medicine | Admitting: Emergency Medicine

## 2020-09-06 ENCOUNTER — Encounter (HOSPITAL_COMMUNITY): Payer: Self-pay

## 2020-09-06 DIAGNOSIS — M25551 Pain in right hip: Secondary | ICD-10-CM | POA: Insufficient documentation

## 2020-09-06 DIAGNOSIS — R440 Auditory hallucinations: Secondary | ICD-10-CM

## 2020-09-06 DIAGNOSIS — Y92410 Unspecified street and highway as the place of occurrence of the external cause: Secondary | ICD-10-CM | POA: Insufficient documentation

## 2020-09-06 DIAGNOSIS — X828XXA Other intentional self-harm by crashing of motor vehicle, initial encounter: Secondary | ICD-10-CM

## 2020-09-06 DIAGNOSIS — R102 Pelvic and perineal pain: Secondary | ICD-10-CM | POA: Insufficient documentation

## 2020-09-06 DIAGNOSIS — Y900 Blood alcohol level of less than 20 mg/100 ml: Secondary | ICD-10-CM | POA: Insufficient documentation

## 2020-09-06 DIAGNOSIS — Z79899 Other long term (current) drug therapy: Secondary | ICD-10-CM | POA: Insufficient documentation

## 2020-09-06 DIAGNOSIS — X822XXA Intentional collision of motor vehicle with tree, initial encounter: Secondary | ICD-10-CM | POA: Insufficient documentation

## 2020-09-06 DIAGNOSIS — F29 Unspecified psychosis not due to a substance or known physiological condition: Secondary | ICD-10-CM | POA: Insufficient documentation

## 2020-09-06 DIAGNOSIS — M25552 Pain in left hip: Secondary | ICD-10-CM | POA: Insufficient documentation

## 2020-09-06 DIAGNOSIS — M545 Low back pain, unspecified: Secondary | ICD-10-CM | POA: Insufficient documentation

## 2020-09-06 DIAGNOSIS — Z20822 Contact with and (suspected) exposure to covid-19: Secondary | ICD-10-CM | POA: Insufficient documentation

## 2020-09-06 DIAGNOSIS — F17210 Nicotine dependence, cigarettes, uncomplicated: Secondary | ICD-10-CM | POA: Insufficient documentation
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
## 2020-09-07 ENCOUNTER — Other Ambulatory Visit: Payer: Self-pay

## 2020-09-07 ENCOUNTER — Emergency Department (HOSPITAL_COMMUNITY): Payer: Self-pay

## 2020-09-07 LAB — CBC WITH DIFFERENTIAL/PLATELET
Abs Immature Granulocytes: 0.03 10*3/uL (ref 0.00–0.07)
Basophils Absolute: 0.1 10*3/uL (ref 0.0–0.1)
Basophils Relative: 1 %
Eosinophils Absolute: 0.1 10*3/uL (ref 0.0–0.5)
Eosinophils Relative: 1 %
HCT: 40.3 % (ref 36.0–46.0)
Hemoglobin: 13.9 g/dL (ref 12.0–15.0)
Immature Granulocytes: 0 %
Lymphocytes Relative: 29 %
Lymphs Abs: 2.8 10*3/uL (ref 0.7–4.0)
MCH: 31.5 pg (ref 26.0–34.0)
MCHC: 34.5 g/dL (ref 30.0–36.0)
MCV: 91.4 fL (ref 80.0–100.0)
Monocytes Absolute: 0.9 10*3/uL (ref 0.1–1.0)
Monocytes Relative: 9 %
Neutro Abs: 5.9 10*3/uL (ref 1.7–7.7)
Neutrophils Relative %: 60 %
Platelets: 290 10*3/uL (ref 150–400)
RBC: 4.41 MIL/uL (ref 3.87–5.11)
RDW: 14 % (ref 11.5–15.5)
WBC: 9.8 10*3/uL (ref 4.0–10.5)
nRBC: 0 % (ref 0.0–0.2)

## 2020-09-07 LAB — COMPREHENSIVE METABOLIC PANEL
ALT: 17 U/L (ref 0–44)
AST: 23 U/L (ref 15–41)
Albumin: 4.5 g/dL (ref 3.5–5.0)
Alkaline Phosphatase: 84 U/L (ref 38–126)
Anion gap: 11 (ref 5–15)
BUN: 19 mg/dL (ref 6–20)
CO2: 24 mmol/L (ref 22–32)
Calcium: 10 mg/dL (ref 8.9–10.3)
Chloride: 103 mmol/L (ref 98–111)
Creatinine, Ser: 0.92 mg/dL (ref 0.44–1.00)
GFR, Estimated: >60 mL/min (ref >60)
Glucose, Bld: 95 mg/dL (ref 70–99)
Potassium: 3.7 mmol/L (ref 3.5–5.1)
Sodium: 138 mmol/L (ref 135–145)
Total Bilirubin: 1 mg/dL (ref 0.3–1.2)
Total Protein: 7.9 g/dL (ref 6.5–8.1)

## 2020-09-07 LAB — RESP PANEL BY RT-PCR (FLU A&B, COVID) ARPGX2
Influenza A by PCR: NEGATIVE
Influenza B by PCR: NEGATIVE
SARS Coronavirus 2 by RT PCR: NEGATIVE

## 2020-09-07 LAB — ETHANOL: Alcohol, Ethyl (B): <10 mg/dL (ref <10)

## 2020-09-07 LAB — I-STAT BETA HCG BLOOD, ED (MC, WL, AP ONLY): I-stat hCG, quantitative: 6.2 m[IU]/mL — ABNORMAL HIGH (ref <5)

## 2020-09-07 MED ORDER — LORAZEPAM 1 MG PO TABS
1.0000 mg | ORAL_TABLET | Freq: Once | ORAL | Status: AC
  Administered 2020-09-07: 1 mg via ORAL
  Filled 2020-09-07: qty 2

## 2020-09-07 MED ORDER — ACETAMINOPHEN 325 MG PO TABS: 650.0000 mg | ORAL_TABLET | ORAL | Status: DC | PRN

## 2020-09-07 MED ORDER — ONDANSETRON HCL 4 MG PO TABS: 4.0000 mg | ORAL_TABLET | Freq: Three times a day (TID) | ORAL | Status: DC | PRN

## 2020-09-07 MED ORDER — ALUM & MAG HYDROXIDE-SIMETH 200-200-20 MG/5ML PO SUSP: 30.0000 mL | Freq: Four times a day (QID) | ORAL | Status: DC | PRN

## 2020-09-07 MED ORDER — NICOTINE 7 MG/24HR TD PT24
7.0000 mg | MEDICATED_PATCH | Freq: Every day | TRANSDERMAL | Status: DC
  Administered 2020-09-07: 7 mg via TRANSDERMAL
  Filled 2020-09-07 (×2): qty 1

## 2020-09-07 NOTE — ED Notes (Addendum)
[facility] Thelma Barge is willing to take pt at this time. Melynda Ripple, Counselor notified regarding possible transfer. Per facility, they have a bed and staff available to take pt as soon as possible.

## 2020-09-07 NOTE — ED Notes (Signed)
Placed Breakfast Order 

## 2020-09-07 NOTE — Progress Notes (Signed)
Patient meets inpatient criteria per Roselyn Bering, NP.  No beds available at [facility] per [facility].  CSW to refer out.  Patient was referred to the following facilities:  Destination  Service Provider Address Phone Fax  [facility]  [address] [place] [address] [phone]  [facility]  [address] [place] [address] [phone]  [facility]  [address], [place] [address] [phone]  [facility]  [address] [place] [address] [phone]  [facility]  [address] [place] [address] [phone]  [facility]  [address], [place] [address] [phone]  [facility]  [address] [place] [address] [phone]  [facility]  [address] [place], [place] [address] [phone]  [facility]  [address] [place] [address] [phone]  [facility]  [address] [place] [address] [phone]  [facility]  [address] [place] [address] [phone]     CSW will continue to monitor for disposition.  Penni Homans, MSW, LCSW 09/07/2020 12:13 PM

## 2020-09-07 NOTE — ED Provider Notes (Signed)
Emergency Medicine Observation Re-evaluation Note  Kathryn Mccall is a 55 y.o. female, seen on rounds today.  Pt initially presented to the ED for complaints of Suicide Attempt Currently, the patient is sleeping peacefully.  Deferred questions at this time given the patient is asleep.  Physical Exam  BP 117/86 (BP Location: Left Arm)   Pulse 72   Temp 98 F (36.7 C) (Oral)   Resp 16   Ht 5\' 9"  (1.753 m)   Wt 65.8 kg   SpO2 100%   BMI 21.41 kg/m  Physical Exam CONSTITUTIONAL:  well-appearing, NAD NEURO:   EYES:   ENT/NECK:  CARDIO:   PULM:  None labored breathing GI/GU:  Abdomen non-distended MSK/SPINE:   SKIN:  no rash obvious, atraumatic, no ecchymosis  PSYCH:    ED Course / MDM  EKG:   I have reviewed the labs performed to date as well as medications administered while in observation.  Recent changes in the last 24 hours include none.  Plan  Current plan is for patient has been found to meet inpatient criteria for psychiatric hospitalization.  Placement is pending at this time.  Kathryn Mccall is not under involuntary commitment.     Barnett Applebaum, Gailen Shelter 09/07/20 0809    11/07/20, DO 09/08/20 1745
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
## 2020-09-07 NOTE — ED Notes (Addendum)
Tele-psych assessment started

## 2020-09-07 NOTE — ED Notes (Signed)
Pt took a shower. Currently feels better. No panic attacks noted. Pt is calm and cooperative. Will continue to monitor.
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
## 2020-09-07 NOTE — ED Notes (Signed)
Pt still sleeping on bed. Pt was cooperative for VS taking. Will continue to monitor. Safety precautions maintained.

## 2020-09-07 NOTE — ED Notes (Addendum)
Pt is requesting to talk to psych at this time. Psych NP sent a secured chat at this time regarding pt request. Waiting for psych to respond.

## 2020-09-07 NOTE — ED Notes (Signed)
Chaplain came in to talk to pt. Pt was also given bible as requested. Pt is calm at this time. Reports Ativan helped in her anxiety. Will continue to monitor. Safety precautions maintained.
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
## 2020-09-07 NOTE — ED Provider Notes (Signed)
Patient was able to sleep this morning.  When she awoke, she was anxious and tearful.  She states that she feels like she feels like she died last night and is now a ghost and this is causing her stress and anxiety.  She denies any chronic alcohol use or chronic benzodiazepine use.  We will give 1 mg of Ativan for symptomatic relief of anxiety and reassess.   Gloris Manchester, MD 09/07/20 [phone]

## 2020-09-07 NOTE — BH Assessment (Addendum)
Disposition:   Received a call from Intake Clinician, "Rowan Blase" with  [facility] Patient has been accepted for admission today. Address: [address], [place], [place] [address]. The attending provider is Nolon Rod, MD. Patient will be going to [address] upon arrival. Nurse report [phone]. Nursing Maryln Manuel, RN) please arrange transport. PLEASE NOTE: Patient must be at their facility no later than 11pm or she will have to come tomorrow morning.    The facility is also asking for this patient to come with medications only for tonight, if any are needed. I was informed that their pharmacy is closed until tomorrow morning hence their reason for requesting medications just for tonight. Clinician will contact case manager to assist with obtaining medications if patient is able to be transported to a facility tonight.   Patient's nurse Maryln Manuel, RN, provided disposition updates.

## 2020-09-07 NOTE — ED Notes (Signed)
Pt was given Ativan PO for anxiety. Pt still crying in room stating, "I think I'm dead. I don't think I'm alive anymore." Pt requested a bible and requested to talk to a chaplain. Chaplain notified at this time of pt request. Pt was given the Daily [employer] booklet at this time until the chaplain brings a bible. Pt is agreeable to plan. Pt is easy to redirect at this time. Will continue to monitor.

## 2020-09-07 NOTE — ED Notes (Signed)
Pt woke up and went to the bathroom. Upon coming back to room. Pt started crying and sobbing. Reports, "No one talks to me. I'm freaking out. I don't know if I'm dead or alive. I think I died. I'm so tired of being alone. I'd rather be dead." Pt redirected by staff at this time. MD spoke to pt at bedside and agrees that pt needs some anxiety medications. Pt is not on any meds but reports, "I smoke weed to help me." Safety precautions maintained. Will continue to monitor.

## 2020-09-07 NOTE — Progress Notes (Signed)
   09/07/20 0953  Clinical Encounter Type  Visited With Patient  Visit Type Initial;Spiritual support;Social support;ED  Referral From Nurse  Consult/Referral To Chaplain  Spiritual Encounters  Spiritual Needs Prayer;Emotional;Sacred text  Stress Factors  Patient Stress Factors Family relationships;Health changes   Chaplain responded to page for Bible and spiritual support. Pt shared feelings of loneliness and current stressors. Chaplain engaged active listening and provided emotional and spiritual support. Chaplain provided a Bible and utilized motivational interviewing. Pt expressed concern over her dogs left in her backyard without food. [name] nurse, Katrina, reached out to Social Work at [facility] and SW said they were unable to help with anything outside the hospital. Pt said she's been unable to call anyone because she doesn't have her phone and said she wants to talk with someone who knows her but her son hasn't answered her calls. Chaplain prayed with Pt for clarity and remains available.   This note was prepared by Chaplain Resident, Tacy Learn, MDiv. Chaplain remains available as needed through the on-call pager: [phone].

## 2020-09-07 NOTE — ED Notes (Signed)
Patient refused a Snack and Drink. 
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
